# Patient Record
Sex: Male | Born: 1955 | Race: White | Hispanic: No | Marital: Married | Smoking: Former smoker
Health system: Southern US, Community
[De-identification: ages and names within clinical notes are randomized; demographics above are authoritative.]

## PROBLEM LIST (undated history)

## (undated) DIAGNOSIS — E119 Type 2 diabetes mellitus without complications: Secondary | ICD-10-CM

## (undated) DIAGNOSIS — I10 Essential (primary) hypertension: Secondary | ICD-10-CM

## (undated) HISTORY — PX: EYE SURGERY: SHX253

## (undated) HISTORY — PX: HERNIA REPAIR: SHX51

---

## 2002-12-06 ENCOUNTER — Encounter: Payer: Self-pay | Admitting: Emergency Medicine

## 2002-12-06 ENCOUNTER — Emergency Department (HOSPITAL_COMMUNITY): Admission: EM | Admit: 2002-12-06 | Discharge: 2002-12-06 | Payer: Self-pay | Admitting: Emergency Medicine

## 2014-10-03 ENCOUNTER — Encounter: Payer: Self-pay | Admitting: *Deleted

## 2014-10-03 ENCOUNTER — Emergency Department (INDEPENDENT_AMBULATORY_CARE_PROVIDER_SITE_OTHER): Payer: PRIVATE HEALTH INSURANCE

## 2014-10-03 ENCOUNTER — Emergency Department
Admission: EM | Admit: 2014-10-03 | Discharge: 2014-10-03 | Disposition: A | Discharge status: Home / Self Care | Payer: PRIVATE HEALTH INSURANCE | Source: Home / Self Care | Attending: Family Medicine | Admitting: Family Medicine

## 2014-10-03 DIAGNOSIS — S93401A Sprain of unspecified ligament of right ankle, initial encounter: Secondary | ICD-10-CM

## 2014-10-03 DIAGNOSIS — M7989 Other specified soft tissue disorders: Secondary | ICD-10-CM

## 2014-10-03 DIAGNOSIS — M7731 Calcaneal spur, right foot: Secondary | ICD-10-CM

## 2014-10-03 HISTORY — DX: Essential (primary) hypertension: I10

## 2014-10-03 HISTORY — DX: Type 2 diabetes mellitus without complications: E11.9

## 2014-10-03 NOTE — Discharge Instructions (Signed)
Apply ice pack for 30 minutes every 2 to 3 times daily until swelling decreases.  Elevate whenever possible.  Wear Ace wrap until swelling decreases.  Wear brace for about 3 to 4 weeks.  Begin range of motion and stretching exercises as per instruction sheet.  May take Ibuprofen 200mg , 4 tabs every 8 hours with food.    Ankle Sprain An ankle sprain is an injury to the strong, fibrous tissues (ligaments) that hold the bones of your ankle joint together.  CAUSES An ankle sprain is usually caused by a fall or by twisting your ankle. Ankle sprains most commonly occur when you step on the outer edge of your foot, and your ankle turns inward. People who participate in sports are more prone to these types of injuries.  SYMPTOMS   Pain in your ankle. The pain may be present at rest or only when you are trying to stand or walk.  Swelling.  Bruising. Bruising may develop immediately or within 1 to 2 days after your injury.  Difficulty standing or walking, particularly when turning corners or changing directions. DIAGNOSIS  Your caregiver will ask you details about your injury and perform a physical exam of your ankle to determine if you have an ankle sprain. During the physical exam, your caregiver will press on and apply pressure to specific areas of your foot and ankle. Your caregiver will try to move your ankle in certain ways. An X-ray exam may be done to be sure a bone was not broken or a ligament did not separate from one of the bones in your ankle (avulsion fracture).  TREATMENT  Certain types of braces can help stabilize your ankle. Your caregiver can make a recommendation for this. Your caregiver may recommend the use of medicine for pain. If your sprain is severe, your caregiver may refer you to a surgeon who helps to restore function to parts of your skeletal system (orthopedist) or a physical therapist. [place] ice to your injury for 1-2 days or as directed by your  caregiver. Applying ice helps to reduce inflammation and pain.  Put ice in a plastic bag.  Place a towel between your skin and the bag.  Leave the ice on for 15-20 minutes at a time, every 2 hours while you are awake.  Only take over-the-counter or prescription medicines for pain, discomfort, or fever as directed by your caregiver.  Elevate your injured ankle above the level of your heart as much as possible for 2-3 days.  If your caregiver recommends crutches, use them as instructed. Gradually put weight on the affected ankle. Continue to use crutches or a cane until you can walk without feeling pain in your ankle.  If you have a plaster splint, wear the splint as directed by your caregiver. Do not rest it on anything harder than a pillow for the first 24 hours. Do not put weight on it. Do not get it wet. You may take it off to take a shower or bath.  You may have been given an elastic bandage to wear around your ankle to provide support. If the elastic bandage is too tight (you have numbness or tingling in your foot or your foot becomes cold and blue), adjust the bandage to make it comfortable.  If you have an air splint, you may blow more air into it or let air out to make it more comfortable. You may take your splint off at night and before taking a  shower or bath. Wiggle your toes in the splint several times per day to decrease swelling. SEEK MEDICAL CARE IF:   You have rapidly increasing bruising or swelling.  Your toes feel extremely cold or you lose feeling in your foot.  Your pain is not relieved with medicine. SEEK IMMEDIATE MEDICAL CARE IF:  Your toes are numb or blue.  You have severe pain that is increasing. MAKE SURE YOU:   Understand these instructions.  Will watch your condition.  Will get help right away if you are not doing well or get worse. Document Released: 06/24/2005 Document Revised: 03/18/2012 Document Reviewed: 07/06/2011 [facility] Patient Information  2015 [place], [place]. This information is not intended to replace advice given to you by your health care provider. Make sure you discuss any questions you have with your health care provider.

## 2014-10-03 NOTE — ED Provider Notes (Signed)
CSN: [id number]     Arrival date & time 10/03/14  1553 History   First MD Initiated Contact with Patient 10/03/14 1645     Chief Complaint  Patient presents with  . Ankle Pain  . Foot Pain      HPI Comments: Patient inverted his right ankle one week ago while descending a step.  He had immediate pain/swelling that has not improved.  Patient is a 59 y.o. male presenting with ankle pain. The history is provided by the patient.  Ankle Pain Location:  Ankle Time since incident:  1 week Injury: yes   Mechanism of injury comment:  Inverted Ankle location:  R ankle Pain details:    Quality:  Aching   Radiates to:  Does not radiate   Severity:  Moderate   Onset quality:  Sudden   Duration:  1 week   Timing:  Constant   Progression:  Unchanged Chronicity:  New Dislocation: no   Prior injury to area:  No Relieved by:  Nothing Worsened by:  Bearing weight Ineffective treatments:  NSAIDs Associated symptoms: decreased ROM, stiffness and swelling   Associated symptoms: no back pain, no muscle weakness, no numbness and no tingling     Past Medical History  Diagnosis Date  . Hypertension   . Diabetes mellitus without complication    Past Surgical History  Procedure Laterality Date  . Eye surgery    . Hernia repair     History reviewed. No pertinent family history. History  Substance Use Topics  . Smoking status: Former Smoker -- 40 years    Quit date: 10/02/2012  . Smokeless tobacco: Not on file  . Alcohol Use: No    Review of Systems  Musculoskeletal: Positive for stiffness. Negative for back pain.  All other systems reviewed and are negative.   Allergies  Review of patient's allergies indicates no known allergies.  Home Medications   Prior to Admission medications   Medication Sig Start Date End Date Taking? Authorizing Provider  buPROPion (WELLBUTRIN) 75 MG tablet Take 75 mg by mouth 2 (two) times daily.   Yes Historical Provider, MD  hydrochlorothiazide  (HYDRODIURIL) 25 MG tablet Take 25 mg by mouth daily.   Yes Historical Provider, MD  insulin glargine (LANTUS) 100 UNIT/ML injection Inject into the skin at bedtime.   Yes Historical Provider, MD  Insulin Lispro, Human, (HUMALOG [place]) Inject into the skin.   Yes Historical Provider, MD  METFORMIN HCL PO Take by mouth.   Yes Historical Provider, MD  olmesartan (BENICAR) 20 MG tablet Take 20 mg by mouth daily.   Yes Historical Provider, MD  sitaGLIPtin (JANUVIA) 25 MG tablet Take 25 mg by mouth daily.   Yes Historical Provider, MD   BP 160/63 mmHg  Pulse 95  Resp 14  Ht 5\' 9"  (1.753 m)  Wt 189 lb (85.73 kg)  BMI 27.90 kg/m2  SpO2 98% Physical Exam  Constitutional: He is oriented to person, place, and time. He appears well-developed and well-nourished. No distress.  HENT:  Head: Normocephalic.  Eyes: Pupils are equal, round, and reactive to light.  Musculoskeletal:       Right ankle: He exhibits decreased range of motion, swelling and ecchymosis. He exhibits no deformity, no laceration and normal pulse. Tenderness. Lateral malleolus, medial malleolus, AITFL, posterior TFL and proximal fibula tenderness found. No CF ligament and no head of 5th metatarsal tenderness found. Achilles tendon normal.       Feet:   Right ankle:  Decreased range  of motion.  Tenderness and swelling over the lateral malleolus.  Joint stable.  No tenderness over the base of the fifth metatarsal.  Distal neurovascular function is intact.   Neurological: He is alert and oriented to person, place, and time.  Skin: Skin is warm and dry.  Nursing note and vitals reviewed.   ED Course  Procedures none     Imaging Review Dg Ankle Complete Right  10/03/2014   CLINICAL DATA:  Inversion injury of the right foot and ankle 1 week ago with persistent pain and swelling and bruising over the right ankle and foot  EXAM: RIGHT ANKLE - COMPLETE 3+ VIEW  COMPARISON:  None.  FINDINGS: The ankle joint mortise is preserved. The talar  dome is intact. There is no acute malleolar fracture. The talus and calcaneus exhibit no acute abnormalities. There are small plantar and Achilles region calcaneal spurs. There is soft tissue swelling anterior and laterally over the right ankle.  IMPRESSION: Considerable soft tissue swelling over the ankle. No acute fracture nor dislocation is demonstrated.   Electronically Signed   By: David  Martinique   On: 10/03/2014 17:18   Dg Foot Complete Right  10/03/2014   CLINICAL DATA:  Inversion injury of the right foot and ankle 1 week ago with persistent pain swelling and bruising laterally  EXAM: RIGHT FOOT COMPLETE - 3+ VIEW  COMPARISON:  None.  FINDINGS: The bones of the right foot are adequately mineralized. The phalanges and metatarsals are intact. There is mild narrowing of the first metatarsophalangeal joint. The interphalangeal joints exhibit mild narrowing at multiple levels. There are tiny bony densities adjacent to the base of the fifth metatarsal but a definite fracture fragment is not identified. The metatarsals and tarsals are unremarkable with exception of plantar and Achilles region calcaneal spurs.  IMPRESSION: 1. There is no acute fracture nor dislocation of the bones of the right foot. 2. There are mild degenerative changes as described. There are plantar and Achilles region spurs.   Electronically Signed   By: David  Martinique   On: 10/03/2014 17:20     MDM   1. Right ankle sprain, initial encounter    Ace wrap and AirCast stirrup splint applied. Apply ice pack for 30 minutes every 2 to 3 times daily until swelling decreases.  Elevate whenever possible.  Wear Ace wrap until swelling decreases.  Wear brace for about 3 to 4 weeks.  Begin range of motion and stretching exercises as per instruction sheet.  May take Ibuprofen 200mg , 4 tabs every 8 hours with food.  Followup with Dr. Aundria Mems ([facility]) if not improving about two weeks.     Kandra Nicolas, MD 10/03/14  1740

## 2014-10-03 NOTE — ED Notes (Signed)
Drew Gilbert reports rolling his right ankle/foot 1 week ago. Pain and swelling present.

## 2014-12-06 ENCOUNTER — Encounter: Payer: Self-pay | Admitting: *Deleted

## 2014-12-06 ENCOUNTER — Emergency Department
Admission: EM | Admit: 2014-12-06 | Discharge: 2014-12-06 | Disposition: A | Discharge status: Home / Self Care | Payer: Managed Care, Other (non HMO) | Source: Home / Self Care | Attending: Emergency Medicine | Admitting: Emergency Medicine

## 2014-12-06 DIAGNOSIS — R591 Generalized enlarged lymph nodes: Secondary | ICD-10-CM | POA: Diagnosis not present

## 2014-12-06 LAB — POCT CBC W AUTO DIFF (K'VILLE URGENT CARE)

## 2014-12-06 LAB — POCT FASTING CBG KUC MANUAL ENTRY: POCT GLUCOSE (MANUAL ENTRY) KUC: 227 mg/dL — AB (ref 70–99)

## 2014-12-06 MED ORDER — CEPHALEXIN 500 MG PO CAPS: 500.0000 mg | ORAL_CAPSULE | Freq: Four times a day (QID) | ORAL | Status: DC

## 2014-12-06 NOTE — ED Provider Notes (Signed)
CSN: [id number]     Arrival date & time 12/06/14  1245 History   First MD Initiated Contact with Patient 12/06/14 1319     Chief Complaint  Patient presents with  . Foot Pain  . axillary redness    (Consider location/radiation/quality/duration/timing/severity/associated sxs/prior Treatment) Patient is a 59 y.o. male presenting with lower extremity pain. The history is provided by the patient. No language interpreter was used.  Foot Pain This is a new problem. The current episode started 12 to 24 hours ago. The problem occurs constantly. The problem has been gradually worsening. Nothing aggravates the symptoms. Nothing relieves the symptoms. He has tried a cold compress for the symptoms.  Pt reports sore areas on both feet.  Pt worried aboutr infection.  Pt reports swollen lymph nodes both underarms with redness.  Pt is diabetic.   Past Medical History  Diagnosis Date  . Hypertension   . Diabetes mellitus without complication    Past Surgical History  Procedure Laterality Date  . Eye surgery    . Hernia repair     History reviewed. No pertinent family history. History  Substance Use Topics  . Smoking status: Former Smoker -- 88 years    Quit date: 10/02/2012  . Smokeless tobacco: Not on file  . Alcohol Use: No    Review of Systems  Musculoskeletal: Positive for joint swelling.  Skin: Positive for rash.  All other systems reviewed and are negative.   Allergies  Review of patient's allergies indicates no known allergies.  Home Medications   Prior to Admission medications   Medication Sig Start Date End Date Taking? Authorizing Provider  buPROPion (WELLBUTRIN) 75 MG tablet Take 75 mg by mouth 2 (two) times daily.    Historical Provider, MD  cephALEXin (KEFLEX) 500 MG capsule Take 1 capsule (500 mg total) by mouth 4 (four) times daily. 12/06/14   Fransico Meadow, PA-C  hydrochlorothiazide (HYDRODIURIL) 25 MG tablet Take 25 mg by mouth daily.    Historical Provider, MD   insulin glargine (LANTUS) 100 UNIT/ML injection Inject into the skin at bedtime.    Historical Provider, MD  Insulin Lispro, Human, (HUMALOG ) Inject into the skin.    Historical Provider, MD  METFORMIN HCL PO Take by mouth.    Historical Provider, MD  olmesartan (BENICAR) 20 MG tablet Take 20 mg by mouth daily.    Historical Provider, MD  sitaGLIPtin (JANUVIA) 25 MG tablet Take 25 mg by mouth daily.    Historical Provider, MD   BP 136/82 mmHg  Pulse 115  Temp(Src) 98.5 F (36.9 C) (Oral)  Resp 14  Wt 179 lb (81.194 kg)  SpO2 97% Physical Exam  Constitutional: He is oriented to person, place, and time. He appears well-developed and well-nourished.  HENT:  Head: Normocephalic.  Eyes: EOM are normal.  Neck: Normal range of motion.  Cardiovascular: Normal rate and normal heart sounds.   Pulmonary/Chest: Effort normal.  Abdominal: He exhibits no distension.  Musculoskeletal:  Redness bottom of right foot,  No streaking,  Left foot 1cm firm area   bilat axilla erythema  Appears to infected hair follicles,  Swollen lymph nodes   Neurological: He is alert and oriented to person, place, and time.  Psychiatric: He has a normal mood and affect.  Nursing note and vitals reviewed.   ED Course  Procedures (including critical care time) Labs Review Labs Reviewed  POCT FASTING CBG [facility] MANUAL ENTRY - Abnormal; Notable for the following:    POCT Glucose (  [facility]) 227 (*)    All other components within normal limits  POCT CBC W AUTO DIFF ([facility])   Wbc's 10 Imaging Review No results found.   MDM   1. Lymphadenopathy    Keflex avs See Dr. Maudie Mercury for recheck in 2-3 days.    Fransico Meadow, PA-C 12/06/14 [place], PA-C 12/06/14 [id number]

## 2014-12-06 NOTE — ED Notes (Signed)
Pt c/o 24 hours of plantart redness, swelling and pain as well as axillary redness and tenderness.

## 2014-12-06 NOTE — Discharge Instructions (Signed)
Swollen Lymph Nodes  The lymphatic system filters fluid from around cells. It is like a system of blood vessels. These channels carry lymph instead of blood. The lymphatic system is an important part of the immune (disease fighting) system. When people talk about "swollen glands in the neck," they are usually talking about swollen lymph nodes. The lymph nodes are like the little traps for infection. You and your caregiver may be able to feel lymph nodes, especially swollen nodes, in these common areas: the groin (inguinal area), armpits (axilla), and above the clavicle (supraclavicular). You may also feel them in the neck (cervical) and the back of the head just above the hairline (occipital).  Swollen glands occur when there is any condition in which the body responds with an allergic type of reaction. For instance, the glands in the neck can become swollen from insect bites or any type of minor infection on the head. These are very noticeable in children with only minor problems. Lymph nodes may also become swollen when there is a tumor or problem with the lymphatic system, such as Hodgkin's disease.  TREATMENT    Most swollen glands do not require treatment. They can be observed (watched) for a short period of time, if your caregiver feels it is necessary. Most of the time, observation is not necessary.   Antibiotics (medicines that kill germs) may be prescribed by your caregiver. Your caregiver may prescribe these if he or she feels the swollen glands are due to a bacterial (germ) infection. Antibiotics are not used if the swollen glands are caused by a virus.  HOME CARE INSTRUCTIONS    Take medications as directed by your caregiver. Only take over-the-counter or prescription medicines for pain, discomfort, or fever as directed by your caregiver.  SEEK MEDICAL CARE IF:    If you begin to run a temperature greater than 102 F (38.9 C), or as your caregiver suggests.  MAKE SURE YOU:    Understand these  instructions.   Will watch your condition.   Will get help right away if you are not doing well or get worse.  Document Released: 06/14/2002 Document Revised: 09/16/2011 Document Reviewed: 06/24/2005  [employer] Patient Information 2015 [employer]. This information is not intended to replace advice given to you by your health care provider. Make sure you discuss any questions you have with your health care provider.

## 2016-04-01 ENCOUNTER — Encounter: Payer: Self-pay | Admitting: *Deleted

## 2016-04-01 ENCOUNTER — Emergency Department (INDEPENDENT_AMBULATORY_CARE_PROVIDER_SITE_OTHER)
Admission: EM | Admit: 2016-04-01 | Discharge: 2016-04-01 | Disposition: A | Discharge status: Home / Self Care | Payer: Medicare Other | Source: Home / Self Care | Attending: Family Medicine | Admitting: Family Medicine

## 2016-04-01 ENCOUNTER — Emergency Department (INDEPENDENT_AMBULATORY_CARE_PROVIDER_SITE_OTHER): Payer: Medicare Other

## 2016-04-01 DIAGNOSIS — R109 Unspecified abdominal pain: Secondary | ICD-10-CM

## 2016-04-01 DIAGNOSIS — N200 Calculus of kidney: Secondary | ICD-10-CM

## 2016-04-01 DIAGNOSIS — K760 Fatty (change of) liver, not elsewhere classified: Secondary | ICD-10-CM | POA: Diagnosis not present

## 2016-04-01 LAB — POCT CBC W AUTO DIFF (K'VILLE URGENT CARE)

## 2016-04-01 LAB — POCT URINALYSIS DIP (MANUAL ENTRY)
BILIRUBIN UA: NEGATIVE
Bilirubin, UA: NEGATIVE
Blood, UA: NEGATIVE
Glucose, UA: 100 — AB
LEUKOCYTES UA: NEGATIVE
Nitrite, UA: NEGATIVE
PH UA: 5 (ref 5–8)
Spec Grav, UA: >=1.03 (ref 1.005–1.03)
Urobilinogen, UA: 0.2 (ref 0–1)

## 2016-04-01 MED ORDER — METRONIDAZOLE 500 MG PO TABS: 500.0000 mg | ORAL_TABLET | Freq: Four times a day (QID) | ORAL | 0 refills | Status: AC

## 2016-04-01 MED ORDER — HYDROCODONE-ACETAMINOPHEN 5-325 MG PO TABS: 1.0000 | ORAL_TABLET | ORAL | 0 refills | Status: AC | PRN

## 2016-04-01 MED ORDER — CIPROFLOXACIN HCL 500 MG PO TABS: 500.0000 mg | ORAL_TABLET | Freq: Two times a day (BID) | ORAL | 0 refills | Status: AC

## 2016-04-01 NOTE — ED Provider Notes (Signed)
[facility]    CSN: [id number] Arrival date & time: 04/01/16  1322  First Provider Contact:  None       History   Chief Complaint Chief Complaint  Patient presents with  . Flank Pain    HPI Drew Gilbert is a 60 y.o. male.   Patient complains of 6 day history of left flank pain radiating to his left lower abdomen.  The pain is a dull ache, worse at night.  He states that he has had some frequency and urgency, but no dysuria or hematuria.  His bowel movements have generally been normal except for bloating and mild constipation during the past several days (last BM yesterday).  He reports that his appetite has been decreased, with nausea but no vomiting.  He states that he has eaten only vegan meals during the past two months.  No fevers, chills, and sweats although he has felt hot. He has a past history of frequent kidney stones, having passed at least 25 (one requiring lithotripsy).  He last passed a stone about 7 years ago.  He states that he started going to a gym recently, but recalls no injury. He notes that he also had an episode of diverticulitis at age 56.  He had a normal colonoscopy 9 years ago.  He also has a surgical history of penile implant January 2017.    Flank Pain  This is a new problem. Episode onset: 6 days ago. The problem occurs constantly. The problem has not changed since onset.Associated symptoms include abdominal pain. Pertinent negatives include no chest pain and no headaches. Nothing aggravates the symptoms. Nothing relieves the symptoms. He has tried acetaminophen (muscle relaxant) for the symptoms. The treatment provided no relief.    Past Medical History:  Diagnosis Date  . Diabetes mellitus without complication ([place])   . Hypertension     There are no active problems to display for this patient.   Past Surgical History:  Procedure Laterality Date  . EYE SURGERY    . HERNIA REPAIR         Home Medications    Prior to  Admission medications   Medication Sig Start Date End Date Taking? Authorizing Provider  buPROPion (WELLBUTRIN) 75 MG tablet Take 75 mg by mouth 2 (two) times daily.    Historical Provider, MD  ciprofloxacin (CIPRO) 500 MG tablet Take 1 tablet (500 mg total) by mouth 2 (two) times daily. 04/01/16   Kandra Nicolas, MD  hydrochlorothiazide (HYDRODIURIL) 25 MG tablet Take 25 mg by mouth daily.    Historical Provider, MD  HYDROcodone-acetaminophen (NORCO/VICODIN) 5-325 MG tablet Take 1 tablet by mouth every 4 (four) hours as needed for moderate pain. 04/01/16   Kandra Nicolas, MD  insulin glargine (LANTUS) 100 UNIT/ML injection Inject into the skin at bedtime.    Historical Provider, MD  Insulin Lispro, Human, (HUMALOG [place]) Inject into the skin.    Historical Provider, MD  METFORMIN HCL PO Take by mouth.    Historical Provider, MD  metroNIDAZOLE (FLAGYL) 500 MG tablet Take 1 tablet (500 mg total) by mouth 4 (four) times daily. 04/01/16   Kandra Nicolas, MD  olmesartan (BENICAR) 20 MG tablet Take 20 mg by mouth daily.    Historical Provider, MD  sitaGLIPtin (JANUVIA) 25 MG tablet Take 25 mg by mouth daily.    Historical Provider, MD    Family History History reviewed. No pertinent family history.  Social History Social History  Substance Use Topics  .  Smoking status: Former Smoker    Years: 35.00    Quit date: 10/02/2012  . Smokeless tobacco: Never Used  . Alcohol use No     Allergies   Review of patient's allergies indicates no known allergies.   Review of Systems Review of Systems  Constitutional: Negative.   HENT: Negative.   Eyes: Negative.   Respiratory: Negative.   Cardiovascular: Negative for chest pain.  Gastrointestinal: Positive for abdominal pain, constipation and nausea. Negative for abdominal distention, blood in stool, diarrhea, rectal pain and vomiting.  Genitourinary: Positive for flank pain and frequency. Negative for decreased urine volume, dysuria, hematuria,  penile pain, scrotal swelling, testicular pain and urgency.  Skin: Negative.   Neurological: Negative for headaches.     Physical Exam Triage Vital Signs ED Triage Vitals  Enc Vitals Group     BP 04/01/16 1358 155/90     Pulse Rate 04/01/16 1358 82     Resp 04/01/16 1358 16     Temp 04/01/16 1358 98.2 F (36.8 C)     Temp Source 04/01/16 1358 Oral     SpO2 04/01/16 1358 98 %     Weight 04/01/16 1359 183 lb (83 kg)     Height 04/01/16 1359 5\' 8"  (1.727 m)     Head Circumference --      Peak Flow --      Pain Score 04/01/16 1402 9     Pain Loc --      Pain Edu? --      Excl. in [place]? --    No data found.   Updated Vital Signs BP 155/90 (BP Location: Left Arm)   Pulse 82   Temp 98.2 F (36.8 C) (Oral)   Resp 16   Ht 5\' 8"  (1.727 m)   Wt 183 lb (83 kg)   SpO2 98%   BMI 27.83 kg/m   Visual Acuity Right Eye Distance:   Left Eye Distance:   Bilateral Distance:    Right Eye Near:   Left Eye Near:    Bilateral Near:     Physical Exam  Constitutional: He appears well-developed and well-nourished. No distress.  HENT:  Head: Normocephalic.  Right Ear: External ear normal.  Left Ear: External ear normal.  Nose: Nose normal.  Mouth/Throat: Oropharynx is clear and moist.  Eyes: Conjunctivae are normal. Pupils are equal, round, and reactive to light.  Neck: Neck supple.  Cardiovascular: Normal heart sounds.   Pulmonary/Chest: Breath sounds normal.  Abdominal: Soft. Bowel sounds are normal. He exhibits no distension and no mass. There is no hepatosplenomegaly. There is tenderness. There is no rebound, no guarding, no tenderness at McBurney's point and negative Murphy's sign. No hernia.    Abdomen has vague mild tenderness to palpation left lower quadrant.  Musculoskeletal: He exhibits no edema.  Lymphadenopathy:    He has no cervical adenopathy.  Neurological: He is alert.  Skin: Skin is warm and dry. No rash noted.  Nursing note and vitals reviewed.    UC  Treatments / Results  Labs (all labs ordered are listed, but only abnormal results are displayed) Labs Reviewed  POCT URINALYSIS DIP (MANUAL ENTRY) - Abnormal; Notable for the following:       Result Value   Glucose, UA =100 (*)    Protein Ur, POC =30 (*) SG >= 1.030  PRO 30mg /dL    All other components within normal limits  POCT CBC W AUTO DIFF ([facility]):  WBC 6.9; LY 37.3; MO  2.4; GR 60.3; Hgb 15.4; Platelets 167     EKG  EKG Interpretation None       Radiology Ct Renal Stone Study  Result Date: 04/01/2016 CLINICAL DATA:  Patient with nausea and left flank pain for 5 days. History of renal stones. EXAM: CT ABDOMEN AND PELVIS WITHOUT CONTRAST TECHNIQUE: Multidetector CT imaging of the abdomen and pelvis was performed following the standard protocol without IV contrast. COMPARISON:  None. FINDINGS: Lower chest: Normal heart size. Coronary arterial vascular calcifications. Minimal atelectasis and or scarring within the right lower lobe. No pleural effusion. Hepatobiliary: Liver is normal in size and contour. Liver is diffusely low in attenuation. Gallbladder is unremarkable. Pancreas: Unremarkable Spleen: Unremarkable Adrenals/Urinary Tract: The adrenal glands are normal. Nonspecific bilateral perinephric fat stranding. 2 mm stone within the inferior pole of the left kidney. 2 mm stone within the interpolar region of the left kidney. No hydronephrosis. No ureterolithiasis. Urinary bladder is unremarkable. Stomach/Bowel: Sigmoid colonic diverticulosis. No CT evidence for acute diverticulitis. The appendix is normal. No abnormal bowel wall thickening or evidence for bowel obstruction. No free fluid or free intraperitoneal air. Normal morphology of the stomach. Vascular/Lymphatic: Normal caliber abdominal aorta. Peripheral calcified atherosclerotic plaque. Circumaortic left renal vein. No retroperitoneal lymphadenopathy. Reproductive: Prostate is enlarged. Central dystrophic  calcifications. Other: Penile prosthesis. Reservoir within the left lower quadrant. Small bilateral fat containing inguinal hernias, left-greater-than-right. There is a small (2 cm) fatty mass within the left lower quadrant with peripheral dystrophic calcification (image 66; series 2). 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 Musculoskeletal: Lumbar spine degenerative changes. No aggressive or acute appearing osseous lesions. Probable bone island within the left sequela (image 73; series 2) IMPRESSION: No acute process within the abdomen or pelvis. No ureterolithiasis.  No hydronephrosis. Hepatic steatosis. Nonobstructing stones within the left kidney. Electronically Signed   By: Lovey Newcomer M.D.   On: 04/01/2016 15:46    Procedures Procedures (including critical care time)  Medications Ordered in UC Medications - No data to display   Initial Impression / Assessment and Plan / UC Course  I have reviewed the triage vital signs and the nursing notes.  Pertinent labs & imaging results that were available during my care of the patient were reviewed by me and considered in my medical decision making (see chart for details).  Clinical Course  No evidence for ureterolithiasis. Although abdominal scan showed no CT evidence for acute diverticulitis, will empirically begin Cipro and Flagyl for five days.  Rx for Lortab for pain. Begin clear liquids for about 24 hours, then may begin a [employer] (Bananas, Rice, Applesauce, Toast). Then gradually advance to a regular diet as tolerated.    Call if rash develops (would begin Valtex). If symptoms become significantly worse during the night or over the weekend, proceed to the local emergency room.  Followup with Family Doctor if not improved in one week.     Final Clinical Impressions(s) / UC Diagnoses   Final diagnoses:  Left flank pain ?etiology    New Prescriptions New Prescriptions   CIPROFLOXACIN (CIPRO) 500 MG TABLET    Take 1 tablet  (500 mg total) by mouth 2 (two) times daily.   HYDROCODONE-ACETAMINOPHEN (NORCO/VICODIN) 5-325 MG TABLET    Take 1 tablet by mouth every 4 (four) hours as needed for moderate pain.   METRONIDAZOLE (FLAGYL) 500 MG TABLET    Take 1 tablet (500 mg total) by mouth 4 (four) times daily.     Kandra Nicolas, MD  04/09/16 1904  

## 2016-04-01 NOTE — ED Triage Notes (Signed)
Pt c/o LT flank pain, bloating and constipation x 6 days. He reports a hx of kidney stones. Denies hematuria. Denies injury, but did recently start going to the gym. He has taken a muscle relaxer at 0300 and tylenol last night without relief.

## 2016-04-01 NOTE — Discharge Instructions (Signed)
Begin clear liquids for about 24 hours, then may begin a [employer] (Bananas, Rice, Applesauce, Toast). Then gradually advance to a regular diet as tolerated.    Call if rash develops. If symptoms become significantly worse during the night or over the weekend, proceed to the local emergency room.

## 2019-12-03 ENCOUNTER — Emergency Department (HOSPITAL_COMMUNITY)
Admission: EM | Admit: 2019-12-03 | Discharge: 2019-12-03 | Disposition: A | Discharge status: Home / Self Care | Payer: Medicare Other | Attending: Emergency Medicine | Admitting: Emergency Medicine

## 2019-12-03 ENCOUNTER — Encounter (HOSPITAL_COMMUNITY): Payer: Self-pay | Admitting: Emergency Medicine

## 2019-12-03 ENCOUNTER — Emergency Department (HOSPITAL_COMMUNITY): Payer: Medicare Other

## 2019-12-03 ENCOUNTER — Other Ambulatory Visit: Payer: Self-pay

## 2019-12-03 DIAGNOSIS — I10 Essential (primary) hypertension: Secondary | ICD-10-CM | POA: Diagnosis not present

## 2019-12-03 DIAGNOSIS — R1084 Generalized abdominal pain: Secondary | ICD-10-CM | POA: Diagnosis not present

## 2019-12-03 DIAGNOSIS — Z7984 Long term (current) use of oral hypoglycemic drugs: Secondary | ICD-10-CM | POA: Insufficient documentation

## 2019-12-03 DIAGNOSIS — Z79899 Other long term (current) drug therapy: Secondary | ICD-10-CM | POA: Diagnosis not present

## 2019-12-03 DIAGNOSIS — R531 Weakness: Secondary | ICD-10-CM | POA: Insufficient documentation

## 2019-12-03 DIAGNOSIS — Z87891 Personal history of nicotine dependence: Secondary | ICD-10-CM | POA: Insufficient documentation

## 2019-12-03 DIAGNOSIS — K8020 Calculus of gallbladder without cholecystitis without obstruction: Secondary | ICD-10-CM

## 2019-12-03 DIAGNOSIS — E860 Dehydration: Secondary | ICD-10-CM | POA: Diagnosis not present

## 2019-12-03 DIAGNOSIS — E119 Type 2 diabetes mellitus without complications: Secondary | ICD-10-CM | POA: Insufficient documentation

## 2019-12-03 DIAGNOSIS — R634 Abnormal weight loss: Secondary | ICD-10-CM | POA: Insufficient documentation

## 2019-12-03 LAB — COMPREHENSIVE METABOLIC PANEL
ALT: 20 U/L (ref 0–44)
AST: 17 U/L (ref 15–41)
Albumin: 4.5 g/dL (ref 3.5–5.0)
Alkaline Phosphatase: 65 U/L (ref 38–126)
Anion gap: 12 (ref 5–15)
BUN: 23 mg/dL (ref 8–23)
CO2: 30 mmol/L (ref 22–32)
Calcium: 9.9 mg/dL (ref 8.9–10.3)
Chloride: 97 mmol/L — ABNORMAL LOW (ref 98–111)
Creatinine, Ser: 1.22 mg/dL (ref 0.61–1.24)
GFR calc Af Amer: >60 mL/min (ref >60)
GFR calc non Af Amer: >60 mL/min (ref >60)
Glucose, Bld: 259 mg/dL — ABNORMAL HIGH (ref 70–99)
Potassium: 4.4 mmol/L (ref 3.5–5.1)
Sodium: 139 mmol/L (ref 135–145)
Total Bilirubin: 1 mg/dL (ref 0.3–1.2)
Total Protein: 8.6 g/dL — ABNORMAL HIGH (ref 6.5–8.1)

## 2019-12-03 LAB — CBC
HCT: 49.7 % (ref 39.0–52.0)
Hemoglobin: 16.8 g/dL (ref 13.0–17.0)
MCH: 32.6 pg (ref 26.0–34.0)
MCHC: 33.8 g/dL (ref 30.0–36.0)
MCV: 96.5 fL (ref 80.0–100.0)
Platelets: 263 10*3/uL (ref 150–400)
RBC: 5.15 MIL/uL (ref 4.22–5.81)
RDW: 12.3 % (ref 11.5–15.5)
WBC: 7.7 10*3/uL (ref 4.0–10.5)
nRBC: 0 % (ref 0.0–0.2)

## 2019-12-03 LAB — URINALYSIS, ROUTINE W REFLEX MICROSCOPIC
Bacteria, UA: NONE SEEN
Bilirubin Urine: NEGATIVE
Glucose, UA: 50 mg/dL — AB
Hgb urine dipstick: NEGATIVE
Ketones, ur: 5 mg/dL — AB
Leukocytes,Ua: NEGATIVE
Nitrite: NEGATIVE
Protein, ur: 30 mg/dL — AB
Specific Gravity, Urine: >1.046 — ABNORMAL HIGH (ref 1.005–1.030)
pH: 5 (ref 5.0–8.0)

## 2019-12-03 LAB — LIPASE, BLOOD: Lipase: 32 U/L (ref 11–51)

## 2019-12-03 MED ORDER — SODIUM CHLORIDE 0.9 % IV BOLUS
1000.0000 mL | Freq: Once | INTRAVENOUS | Status: AC
  Administered 2019-12-03: 1000 mL via INTRAVENOUS

## 2019-12-03 MED ORDER — TAMSULOSIN HCL 0.4 MG PO CAPS: 0.4000 mg | ORAL_CAPSULE | Freq: Every day | ORAL | 0 refills | Status: AC

## 2019-12-03 MED ORDER — IOHEXOL 300 MG/ML  SOLN
100.0000 mL | Freq: Once | INTRAMUSCULAR | Status: AC | PRN
  Administered 2019-12-03: 100 mL via INTRAVENOUS

## 2019-12-03 MED ORDER — SODIUM CHLORIDE 0.9% FLUSH: 3.0000 mL | Freq: Once | INTRAVENOUS | Status: DC

## 2019-12-03 MED ORDER — SODIUM CHLORIDE (PF) 0.9 % IJ SOLN
INTRAMUSCULAR | Status: AC
  Filled 2019-12-03: qty 50

## 2019-12-03 NOTE — ED Triage Notes (Signed)
Pt sent by PCP for abd issues, fatigue, dehydration. Had nausea, abd pain and back pains x 8 days. Not been eating due to abd pains getting worse

## 2019-12-03 NOTE — ED Notes (Signed)
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

## 2019-12-03 NOTE — Discharge Instructions (Signed)
As discussed, your evaluation today has been largely reassuring.  But, it is important that you monitor your condition carefully, and do not hesitate to return to the ED if you develop new, or concerning changes in your condition. ? ?Otherwise, please follow-up with your physician for appropriate ongoing care. ? ?

## 2019-12-03 NOTE — ED Provider Notes (Signed)
[facility] DEPT Provider Note   CSN: [id number] Arrival date & time: 12/03/19  1130     History Chief Complaint  Patient presents with  . Nausea  . Back Pain  . sent by PCP    for fluids     Drew Gilbert is a 64 y.o. male.  HPI    Patient presents with concern of weakness, anorexia, weight loss, abdominal pain. He is here with his wife, and history is obtained by him, and his chart from his primary care physician's office.  He was seen earlier in the day due to these concerns.  Onset seems to been a few weeks, possibly longer in the past.  However, he is increasingly symptomatic over the past few days, to the point where he now requires multiple rest periods before performing ADL.  There is both fatigue and weakness, diffusely without focality.  No fever.  He does have some inconsistent abdominal pain, though none currently. No confusion, disorientation. No recent medication change, diet change, activity change beyond those associated with his anorexia. Today after seeing his physician, having abnormal test results he was sent here for evaluation. Past Medical History:  Diagnosis Date  . Diabetes mellitus without complication ([place])   . Hypertension     There are no problems to display for this patient.   Past Surgical History:  Procedure Laterality Date  . EYE SURGERY    . HERNIA REPAIR         No family history on file.  Social History   Tobacco Use  . Smoking status: Former Smoker    Years: 35.00    Quit date: 10/02/2012    Years since quitting: 7.1  . Smokeless tobacco: Never Used  Substance Use Topics  . Alcohol use: No  . Drug use: No    Home Medications Prior to Admission medications   Medication Sig Start Date End Date Taking? Authorizing Provider  APPLE CIDER VINEGAR PO Take 1 capsule by mouth daily.   Yes [provider]  Ascorbic Acid (VITAMIN C) 100 MG tablet Take 100 mg by mouth daily.   Yes [provider]  busPIRone (BUSPAR) 15 MG tablet Take 15 mg by mouth 2 (two) times daily. 11/09/19  Yes [provider]  gabapentin (NEURONTIN) 300 MG capsule Take 300 mg by mouth at bedtime.   Yes [provider]  glimepiride (AMARYL) 2 MG tablet Take 2 mg by mouth daily with breakfast.   Yes [provider]  losartan (COZAAR) 100 MG tablet Take 100 mg by mouth daily. 11/08/19  Yes [provider]  metFORMIN (GLUCOPHAGE-XR) 500 MG 24 hr tablet Take 1,000 mg by mouth in the morning and at bedtime.   Yes [provider]  metoprolol tartrate (LOPRESSOR) 50 MG tablet Take 50 mg by mouth 2 (two) times daily. 11/09/19  Yes [provider]  milk thistle 175 MG tablet Take 175 mg by mouth daily.   Yes [provider]  olmesartan (BENICAR) 20 MG tablet Take 20 mg by mouth daily.   Yes [provider]  Omega-3 Fatty Acids (FISH OIL) 1000 MG CAPS Take 1 capsule by mouth daily.   Yes [provider]  ondansetron (ZOFRAN) 8 MG tablet Take 8 mg by mouth 3 (three) times daily as needed for nausea or vomiting.  11/30/19  Yes [provider]  sildenafil (REVATIO) 20 MG tablet Take 20 mg by mouth daily as needed (ed).  11/21/19  Yes [provider]  simvastatin (ZOCOR) 20 MG tablet Take 20 mg by mouth every evening.  11/09/19  Yes [provider]  trazodone (DESYREL) 300 MG tablet Take 300 mg by mouth at bedtime as needed. 11/13/19  Yes [provider]  vitamin E 1000 UNIT capsule Take 1,000 Units by mouth daily.   Yes [provider]  ciprofloxacin (CIPRO) 500 MG tablet Take 1 tablet (500 mg total) by mouth 2 (two) times daily. Patient not taking: Reported on 12/03/2019 04/01/16   Kandra Nicolas, MD  HYDROcodone-acetaminophen (NORCO/VICODIN) 5-325 MG tablet Take 1 tablet by mouth every 4 (four) hours as needed for moderate pain. Patient not taking: Reported on 12/03/2019 04/01/16   Kandra Nicolas, MD    metroNIDAZOLE (FLAGYL) 500 MG tablet Take 1 tablet (500 mg total) by mouth 4 (four) times daily. Patient not taking: Reported on 12/03/2019 04/01/16   Kandra Nicolas, MD    Allergies    Patient has no known allergies.  Review of Systems   Review of Systems  Constitutional:       Per HPI, otherwise negative  HENT:       Per HPI, otherwise negative  Respiratory:       Per HPI, otherwise negative  Cardiovascular:       Per HPI, otherwise negative  Gastrointestinal: Positive for abdominal pain and nausea. Negative for vomiting.  Endocrine:       Negative aside from HPI  Genitourinary:       Neg aside from HPI   Musculoskeletal:       Per HPI, otherwise negative  Skin: Negative.   Neurological: Positive for weakness. Negative for syncope.    Physical Exam Updated Vital Signs BP (!) 167/83 (BP Location: Right Arm)   Pulse 79   Temp 98.1 F (36.7 C)   Resp 17   SpO2 97%   Physical Exam Vitals and nursing note reviewed.  Constitutional:      General: He is not in acute distress.    Appearance: He is well-developed.  HENT:     Head: Normocephalic and atraumatic.  Eyes:     Conjunctiva/sclera: Conjunctivae normal.  Cardiovascular:     Rate and Rhythm: Normal rate and regular rhythm.  Pulmonary:     Effort: Pulmonary effort is normal. No respiratory distress.     Breath sounds: No stridor.  Abdominal:     General: There is no distension.     Tenderness: There is generalized abdominal tenderness.  Skin:    General: Skin is warm and dry.  Neurological:     Mental Status: He is alert and oriented to person, place, and time.     ED Results / Procedures / Treatments   Labs (all labs ordered are listed, but only abnormal results are displayed) Labs Reviewed  COMPREHENSIVE METABOLIC PANEL - Abnormal; Notable for the following components:      Result Value   Chloride 97 (*)    Glucose, Bld 259 (*)    Total Protein 8.6 (*)    All other components within normal  limits  URINALYSIS, ROUTINE W REFLEX MICROSCOPIC - Abnormal; Notable for the following components:   Specific Gravity, Urine >1.046 (*)    Glucose, UA 50 (*)    Ketones, ur 5 (*)    Protein, ur 30 (*)    All other components within normal limits  LIPASE, BLOOD  CBC    EKG None  Radiology CT Abdomen Pelvis W Contrast  Result Date: 12/03/2019  CLINICAL DATA:  Fatigue. Nausea and abdominal pain. Back pain. EXAM: CT ABDOMEN AND PELVIS WITH CONTRAST TECHNIQUE: Multidetector CT imaging of the abdomen and pelvis was performed using the standard protocol following bolus administration of intravenous contrast. CONTRAST:  134mL OMNIPAQUE IOHEXOL 300 MG/ML  SOLN COMPARISON:  04/01/2016 FINDINGS: Lower chest: The lung bases are clear. The heart size is normal. Hepatobiliary: There is decreased hepatic attenuation suggestive of hepatic steatosis. There is a questionable gallstone near the gallbladder neck. Alternatively, this may represent a prominent gallbladder fold.There is no biliary ductal dilation. Pancreas: Normal contours without ductal dilatation. No peripancreatic fluid collection. Spleen: Unremarkable. Adrenals/Urinary Tract: --Adrenal glands: Unremarkable. --Right kidney/ureter: No hydronephrosis or radiopaque kidney stones. --Left kidney/ureter: There are multiple nonobstructing stones involving the left kidney. --Urinary bladder: Unremarkable. Stomach/Bowel: --Stomach/Duodenum: No hiatal hernia or other gastric abnormality. Normal duodenal course and caliber. --Small bowel: Unremarkable. --Colon: Rectosigmoid diverticulosis without acute inflammation. --Appendix: Normal. Vascular/Lymphatic: Atherosclerotic calcification is present within the non-aneurysmal abdominal aorta, without hemodynamically significant stenosis. There is a circumaortic left renal vein, a normal variant. --No retroperitoneal lymphadenopathy. --No mesenteric lymphadenopathy. --No pelvic or inguinal lymphadenopathy. Reproductive:  The prostate gland is significantly enlarged. A penile prosthesis is noted. Other: No ascites or free air. There are bilateral fat containing inguinal hernias. Musculoskeletal. 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 There is a stable sclerotic lesion in the left hemi sacrum. IMPRESSION: 1. No acute abdominopelvic abnormality. 2. Hepatic steatosis. 3. Possible gallstone near the gallbladder neck. Alternatively, this may represent a prominent gallbladder fold. Consider further evaluation with right upper quadrant ultrasound. 4. Nonobstructive left nephrolithiasis. 5. Rectosigmoid diverticulosis without acute inflammation. 6. Bilateral fat containing inguinal hernias. 7. Markedly enlarged prostate gland. Aortic Atherosclerosis (ICD10-I70.0). Electronically Signed   By: Constance Holster M.D.   On: 12/03/2019 20:17   US Abdomen Limited RUQ  Result Date: 12/03/2019 CLINICAL DATA:  Gallstones EXAM: ULTRASOUND ABDOMEN LIMITED RIGHT UPPER QUADRANT COMPARISON:  CT 12/03/2019 FINDINGS: Gallbladder: Sludge in the gallbladder. No shadowing stone. Normal gallbladder wall thickness. Negative sonographic Murphy Common bile duct: Diameter: 6 mm Liver: Liver is echogenic. No focal hepatic abnormality. Portal vein is patent on color Doppler imaging with normal direction of blood flow towards the liver. Other: None. IMPRESSION: 1. Gallbladder sludge without shadowing stone. No sonographic features to suggest acute cholecystitis 2. Echogenic liver consistent with steatosis. Electronically Signed   By: Donavan Foil M.D.   On: 12/03/2019 21:26    Procedures Procedures (including critical care time)  Medications Ordered in ED Medications  sodium chloride flush (NS) 0.9 % injection 3 mL (3 mLs Intravenous Not Given 12/03/19 2042)  sodium chloride (PF) 0.9 % injection (has no administration in time range)  sodium chloride 0.9 % bolus 1,000 mL (0 mLs Intravenous Stopped 12/03/19 2129)    iohexol (OMNIPAQUE) 300 MG/ML solution 100 mL (100 mLs Intravenous Contrast Given 12/03/19 1959)    ED Course  I have reviewed the triage vital signs and the nursing notes.  Pertinent labs & imaging results that were available during my care of the patient were reviewed by me and considered in my medical decision making (see chart for details). Update:, Initial CT scan suggests possible biliary lesion, labs otherwise generally reassuring aside from some evidence for dehydration.  Ultrasound ordered 10:50 PM Ultrasound results reviewed, remaining labs including urinalysis reviewed, discussed with the patient and his wife. Findings consistent with mild dehydration, otherwise generally reassuring.  There is some evidence for prostate enlargement as well.  Patient remains awake, alert, is now sitting upright, fully dressed, speaking clearly. He notes that he feels somewhat better after fluid resuscitation.  This 64 year old male presents with concern for weakness, anorexia, weight loss, abdominal pain. Evaluation here generally reassuring, though there is evidence for dehydration.  No evidence for obvious mass, hepatobiliary lesions, dysfunction, urinary tract infection. Some suspicion for dehydration versus prostatic mass-effect/urinary frequency contributing to his discomfort.  With improvement here following fluid resuscitation, absence of other acute findings, patient amenable to, appropriate for follow-up with primary care, GI and urology (patient requests Flomax). Final Clinical Impression(s) / ED Diagnoses Final diagnoses:  Weakness  Dehydration  Generalized abdominal pain    Rx / DC Orders ED Discharge Orders         Ordered    tamsulosin (FLOMAX) 0.4 MG CAPS capsule  Daily     12/03/19 2253           Carmin Muskrat, MD 12/03/19 2253

## 2020-02-24 ENCOUNTER — Other Ambulatory Visit: Payer: Self-pay | Admitting: Gastroenterology

## 2020-02-24 ENCOUNTER — Other Ambulatory Visit (HOSPITAL_COMMUNITY): Payer: Self-pay | Admitting: Gastroenterology

## 2020-02-24 DIAGNOSIS — R110 Nausea: Secondary | ICD-10-CM

## 2020-03-28 ENCOUNTER — Encounter (HOSPITAL_COMMUNITY)
Admission: RE | Admit: 2020-03-28 | Discharge: 2020-03-28 | Disposition: A | Discharge status: Home / Self Care | Payer: Medicare Other | Source: Ambulatory Visit | Attending: Gastroenterology | Admitting: Gastroenterology

## 2020-03-28 ENCOUNTER — Other Ambulatory Visit: Payer: Self-pay

## 2020-03-28 DIAGNOSIS — R110 Nausea: Secondary | ICD-10-CM

## 2020-03-28 MED ORDER — TECHNETIUM TC 99M MEBROFENIN IV KIT
5.4000 | PACK | Freq: Once | INTRAVENOUS | Status: AC | PRN
  Administered 2020-03-28: 5.4 via INTRAVENOUS

## 2020-05-09 ENCOUNTER — Other Ambulatory Visit: Payer: Self-pay | Admitting: Surgery

## 2020-05-22 ENCOUNTER — Ambulatory Visit: Payer: Medicare Other | Admitting: Orthopaedic Surgery

## 2020-05-31 ENCOUNTER — Ambulatory Visit: Payer: Medicare Other | Admitting: Orthopaedic Surgery

## 2020-08-23 DIAGNOSIS — I10 Essential (primary) hypertension: Secondary | ICD-10-CM | POA: Diagnosis not present

## 2020-08-23 DIAGNOSIS — E119 Type 2 diabetes mellitus without complications: Secondary | ICD-10-CM | POA: Diagnosis not present

## 2020-08-23 DIAGNOSIS — R7989 Other specified abnormal findings of blood chemistry: Secondary | ICD-10-CM | POA: Diagnosis not present

## 2020-08-23 DIAGNOSIS — E785 Hyperlipidemia, unspecified: Secondary | ICD-10-CM | POA: Diagnosis not present

## 2020-08-30 DIAGNOSIS — L989 Disorder of the skin and subcutaneous tissue, unspecified: Secondary | ICD-10-CM | POA: Diagnosis not present

## 2020-08-30 DIAGNOSIS — I10 Essential (primary) hypertension: Secondary | ICD-10-CM | POA: Diagnosis not present

## 2020-09-04 DIAGNOSIS — D1801 Hemangioma of skin and subcutaneous tissue: Secondary | ICD-10-CM | POA: Diagnosis not present

## 2020-09-04 DIAGNOSIS — L820 Inflamed seborrheic keratosis: Secondary | ICD-10-CM | POA: Diagnosis not present

## 2020-09-13 DIAGNOSIS — E785 Hyperlipidemia, unspecified: Secondary | ICD-10-CM | POA: Diagnosis not present

## 2020-09-13 DIAGNOSIS — E119 Type 2 diabetes mellitus without complications: Secondary | ICD-10-CM | POA: Diagnosis not present

## 2020-10-05 DIAGNOSIS — I10 Essential (primary) hypertension: Secondary | ICD-10-CM | POA: Diagnosis not present

## 2020-10-05 DIAGNOSIS — E1121 Type 2 diabetes mellitus with diabetic nephropathy: Secondary | ICD-10-CM | POA: Diagnosis not present

## 2020-10-05 DIAGNOSIS — E785 Hyperlipidemia, unspecified: Secondary | ICD-10-CM | POA: Diagnosis not present

## 2020-11-21 DIAGNOSIS — E785 Hyperlipidemia, unspecified: Secondary | ICD-10-CM | POA: Diagnosis not present

## 2020-11-21 DIAGNOSIS — I10 Essential (primary) hypertension: Secondary | ICD-10-CM | POA: Diagnosis not present

## 2020-11-21 DIAGNOSIS — E1121 Type 2 diabetes mellitus with diabetic nephropathy: Secondary | ICD-10-CM | POA: Diagnosis not present

## 2020-11-21 DIAGNOSIS — R55 Syncope and collapse: Secondary | ICD-10-CM | POA: Diagnosis not present

## 2020-12-07 DIAGNOSIS — R5383 Other fatigue: Secondary | ICD-10-CM | POA: Diagnosis not present

## 2020-12-07 DIAGNOSIS — I10 Essential (primary) hypertension: Secondary | ICD-10-CM | POA: Diagnosis not present

## 2020-12-07 DIAGNOSIS — E785 Hyperlipidemia, unspecified: Secondary | ICD-10-CM | POA: Diagnosis not present

## 2020-12-07 DIAGNOSIS — R7989 Other specified abnormal findings of blood chemistry: Secondary | ICD-10-CM | POA: Diagnosis not present

## 2020-12-07 DIAGNOSIS — E1149 Type 2 diabetes mellitus with other diabetic neurological complication: Secondary | ICD-10-CM | POA: Diagnosis not present

## 2020-12-14 DIAGNOSIS — I10 Essential (primary) hypertension: Secondary | ICD-10-CM | POA: Diagnosis not present

## 2020-12-14 DIAGNOSIS — E785 Hyperlipidemia, unspecified: Secondary | ICD-10-CM | POA: Diagnosis not present

## 2020-12-14 DIAGNOSIS — E119 Type 2 diabetes mellitus without complications: Secondary | ICD-10-CM | POA: Diagnosis not present

## 2021-02-21 DIAGNOSIS — I10 Essential (primary) hypertension: Secondary | ICD-10-CM | POA: Diagnosis not present

## 2021-02-21 DIAGNOSIS — E1149 Type 2 diabetes mellitus with other diabetic neurological complication: Secondary | ICD-10-CM | POA: Diagnosis not present

## 2021-02-21 DIAGNOSIS — E785 Hyperlipidemia, unspecified: Secondary | ICD-10-CM | POA: Diagnosis not present

## 2021-06-20 DIAGNOSIS — E119 Type 2 diabetes mellitus without complications: Secondary | ICD-10-CM | POA: Diagnosis not present

## 2021-06-20 DIAGNOSIS — E785 Hyperlipidemia, unspecified: Secondary | ICD-10-CM | POA: Diagnosis not present

## 2021-06-25 DIAGNOSIS — I10 Essential (primary) hypertension: Secondary | ICD-10-CM | POA: Diagnosis not present

## 2021-06-25 DIAGNOSIS — R3914 Feeling of incomplete bladder emptying: Secondary | ICD-10-CM | POA: Diagnosis not present

## 2021-06-25 DIAGNOSIS — E785 Hyperlipidemia, unspecified: Secondary | ICD-10-CM | POA: Diagnosis not present

## 2021-06-25 DIAGNOSIS — E1149 Type 2 diabetes mellitus with other diabetic neurological complication: Secondary | ICD-10-CM | POA: Diagnosis not present

## 2021-12-18 DIAGNOSIS — E785 Hyperlipidemia, unspecified: Secondary | ICD-10-CM | POA: Diagnosis not present

## 2021-12-18 DIAGNOSIS — E1149 Type 2 diabetes mellitus with other diabetic neurological complication: Secondary | ICD-10-CM | POA: Diagnosis not present

## 2021-12-18 IMAGING — NM NM HEPATO W/GB/PHARM/[PERSON_NAME]
3 series · 18 of 18 positions shown · non-contrast
Comparison: CT and ultrasound examinations 12/03/2019

CLINICAL DATA: Nausea and unintentional weight loss.

EXAM:
NUCLEAR MEDICINE HEPATOBILIARY IMAGING
TECHNIQUE: Sequential images of the abdomen were obtained [DATE] minutes
following intravenous administration of radiopharmaceutical.
RADIOPHARMACEUTICALS:  5.4 mCi 7c-WWm  Choletec IV

[Series 1: hida scan · 3.28mm/px · 6 of 30 frames shown (1 of 3)]
[frame 3/30]
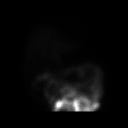
[frame 8/30]
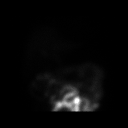
[frame 13/30]
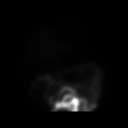
[frame 18/30]
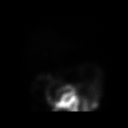
[frame 23/30]
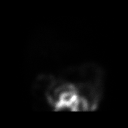
[frame 28/30]
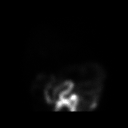

[Series 1: hida scan · 3.28mm/px · 6 of 60 frames shown (2 of 3)]
[frame 6/60]
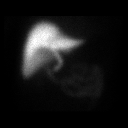
[frame 16/60]
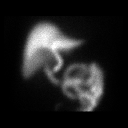
[frame 26/60]
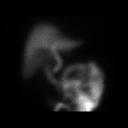
[frame 36/60]
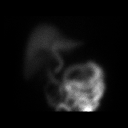
[frame 46/60]
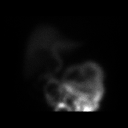
[frame 56/60]
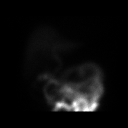

[Series 1: hida scan · 3.28mm/px · 6 of 30 frames shown (3 of 3)]
[frame 3/30]
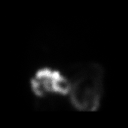
[frame 8/30]
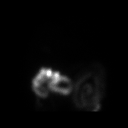
[frame 13/30]
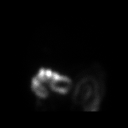
[frame 18/30]
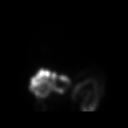
[frame 23/30]
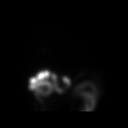
[frame 28/30]
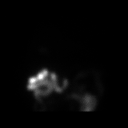

[18 of 18 positions shown; findings below may reference images not displayed]

FINDINGS: Prompt and symmetric uptake in the liver. Prompt excretion into the
biliary tree which is visualized by 5 minutes. Activity is seen in
the small bowel by 10 minutes.

Delayed appearance of the gallbladder after 1 hour.
IMPRESSION: Patent cystic and common bile ducts.

## 2021-12-25 DIAGNOSIS — I10 Essential (primary) hypertension: Secondary | ICD-10-CM | POA: Diagnosis not present

## 2021-12-25 DIAGNOSIS — E785 Hyperlipidemia, unspecified: Secondary | ICD-10-CM | POA: Diagnosis not present

## 2021-12-25 DIAGNOSIS — Z0000 Encounter for general adult medical examination without abnormal findings: Secondary | ICD-10-CM | POA: Diagnosis not present

## 2021-12-25 DIAGNOSIS — E1143 Type 2 diabetes mellitus with diabetic autonomic (poly)neuropathy: Secondary | ICD-10-CM | POA: Diagnosis not present

## 2021-12-25 DIAGNOSIS — F5101 Primary insomnia: Secondary | ICD-10-CM | POA: Diagnosis not present

## 2021-12-25 DIAGNOSIS — E1121 Type 2 diabetes mellitus with diabetic nephropathy: Secondary | ICD-10-CM | POA: Diagnosis not present

## 2022-02-12 DIAGNOSIS — H40003 Preglaucoma, unspecified, bilateral: Secondary | ICD-10-CM | POA: Diagnosis not present

## 2022-02-12 DIAGNOSIS — H25813 Combined forms of age-related cataract, bilateral: Secondary | ICD-10-CM | POA: Diagnosis not present

## 2022-02-12 DIAGNOSIS — H524 Presbyopia: Secondary | ICD-10-CM | POA: Diagnosis not present

## 2022-02-12 DIAGNOSIS — H43813 Vitreous degeneration, bilateral: Secondary | ICD-10-CM | POA: Diagnosis not present

## 2022-02-12 DIAGNOSIS — E119 Type 2 diabetes mellitus without complications: Secondary | ICD-10-CM | POA: Diagnosis not present

## 2022-03-14 DIAGNOSIS — Z87442 Personal history of urinary calculi: Secondary | ICD-10-CM | POA: Diagnosis not present

## 2022-03-14 DIAGNOSIS — E119 Type 2 diabetes mellitus without complications: Secondary | ICD-10-CM | POA: Diagnosis not present

## 2022-03-14 DIAGNOSIS — Z794 Long term (current) use of insulin: Secondary | ICD-10-CM | POA: Diagnosis not present

## 2022-04-04 DIAGNOSIS — I10 Essential (primary) hypertension: Secondary | ICD-10-CM | POA: Diagnosis not present

## 2022-04-04 DIAGNOSIS — E1121 Type 2 diabetes mellitus with diabetic nephropathy: Secondary | ICD-10-CM | POA: Diagnosis not present

## 2022-04-04 DIAGNOSIS — E785 Hyperlipidemia, unspecified: Secondary | ICD-10-CM | POA: Diagnosis not present

## 2022-04-04 DIAGNOSIS — E1143 Type 2 diabetes mellitus with diabetic autonomic (poly)neuropathy: Secondary | ICD-10-CM | POA: Diagnosis not present

## 2022-06-13 DIAGNOSIS — Z794 Long term (current) use of insulin: Secondary | ICD-10-CM | POA: Diagnosis not present

## 2022-06-13 DIAGNOSIS — R350 Frequency of micturition: Secondary | ICD-10-CM | POA: Diagnosis not present

## 2022-06-13 DIAGNOSIS — E119 Type 2 diabetes mellitus without complications: Secondary | ICD-10-CM | POA: Diagnosis not present

## 2022-06-13 DIAGNOSIS — Z960 Presence of urogenital implants: Secondary | ICD-10-CM | POA: Diagnosis not present

## 2022-06-19 DIAGNOSIS — I10 Essential (primary) hypertension: Secondary | ICD-10-CM | POA: Diagnosis not present

## 2022-06-19 DIAGNOSIS — E1121 Type 2 diabetes mellitus with diabetic nephropathy: Secondary | ICD-10-CM | POA: Diagnosis not present

## 2022-06-19 DIAGNOSIS — E785 Hyperlipidemia, unspecified: Secondary | ICD-10-CM | POA: Diagnosis not present

## 2022-06-26 DIAGNOSIS — F5101 Primary insomnia: Secondary | ICD-10-CM | POA: Diagnosis not present

## 2022-06-26 DIAGNOSIS — E1143 Type 2 diabetes mellitus with diabetic autonomic (poly)neuropathy: Secondary | ICD-10-CM | POA: Diagnosis not present

## 2022-06-26 DIAGNOSIS — E785 Hyperlipidemia, unspecified: Secondary | ICD-10-CM | POA: Diagnosis not present

## 2022-06-26 DIAGNOSIS — E1121 Type 2 diabetes mellitus with diabetic nephropathy: Secondary | ICD-10-CM | POA: Diagnosis not present

## 2022-06-26 DIAGNOSIS — I10 Essential (primary) hypertension: Secondary | ICD-10-CM | POA: Diagnosis not present

## 2022-09-18 DIAGNOSIS — E1121 Type 2 diabetes mellitus with diabetic nephropathy: Secondary | ICD-10-CM | POA: Diagnosis not present

## 2022-09-18 DIAGNOSIS — I10 Essential (primary) hypertension: Secondary | ICD-10-CM | POA: Diagnosis not present

## 2022-09-18 DIAGNOSIS — R5383 Other fatigue: Secondary | ICD-10-CM | POA: Diagnosis not present

## 2022-09-24 DIAGNOSIS — I10 Essential (primary) hypertension: Secondary | ICD-10-CM | POA: Diagnosis not present

## 2022-09-24 DIAGNOSIS — E1121 Type 2 diabetes mellitus with diabetic nephropathy: Secondary | ICD-10-CM | POA: Diagnosis not present

## 2022-09-24 DIAGNOSIS — E1143 Type 2 diabetes mellitus with diabetic autonomic (poly)neuropathy: Secondary | ICD-10-CM | POA: Diagnosis not present

## 2022-09-24 DIAGNOSIS — E785 Hyperlipidemia, unspecified: Secondary | ICD-10-CM | POA: Diagnosis not present

## 2022-09-25 DIAGNOSIS — E1143 Type 2 diabetes mellitus with diabetic autonomic (poly)neuropathy: Secondary | ICD-10-CM | POA: Diagnosis not present

## 2022-09-25 DIAGNOSIS — I10 Essential (primary) hypertension: Secondary | ICD-10-CM | POA: Diagnosis not present

## 2022-09-25 DIAGNOSIS — E785 Hyperlipidemia, unspecified: Secondary | ICD-10-CM | POA: Diagnosis not present

## 2022-09-25 DIAGNOSIS — E1121 Type 2 diabetes mellitus with diabetic nephropathy: Secondary | ICD-10-CM | POA: Diagnosis not present

## 2022-09-25 DIAGNOSIS — F5101 Primary insomnia: Secondary | ICD-10-CM | POA: Diagnosis not present

## 2022-12-24 DIAGNOSIS — E785 Hyperlipidemia, unspecified: Secondary | ICD-10-CM | POA: Diagnosis not present

## 2022-12-24 DIAGNOSIS — E1121 Type 2 diabetes mellitus with diabetic nephropathy: Secondary | ICD-10-CM | POA: Diagnosis not present

## 2022-12-24 DIAGNOSIS — I10 Essential (primary) hypertension: Secondary | ICD-10-CM | POA: Diagnosis not present

## 2022-12-26 DIAGNOSIS — E1143 Type 2 diabetes mellitus with diabetic autonomic (poly)neuropathy: Secondary | ICD-10-CM | POA: Diagnosis not present

## 2022-12-26 DIAGNOSIS — R55 Syncope and collapse: Secondary | ICD-10-CM | POA: Diagnosis not present

## 2022-12-26 DIAGNOSIS — E785 Hyperlipidemia, unspecified: Secondary | ICD-10-CM | POA: Diagnosis not present

## 2022-12-26 DIAGNOSIS — I10 Essential (primary) hypertension: Secondary | ICD-10-CM | POA: Diagnosis not present

## 2022-12-26 DIAGNOSIS — E1121 Type 2 diabetes mellitus with diabetic nephropathy: Secondary | ICD-10-CM | POA: Diagnosis not present

## 2022-12-31 DIAGNOSIS — R5383 Other fatigue: Secondary | ICD-10-CM | POA: Diagnosis not present

## 2022-12-31 DIAGNOSIS — E1121 Type 2 diabetes mellitus with diabetic nephropathy: Secondary | ICD-10-CM | POA: Diagnosis not present

## 2022-12-31 DIAGNOSIS — E1143 Type 2 diabetes mellitus with diabetic autonomic (poly)neuropathy: Secondary | ICD-10-CM | POA: Diagnosis not present

## 2022-12-31 DIAGNOSIS — I10 Essential (primary) hypertension: Secondary | ICD-10-CM | POA: Diagnosis not present

## 2022-12-31 DIAGNOSIS — F5101 Primary insomnia: Secondary | ICD-10-CM | POA: Diagnosis not present

## 2022-12-31 DIAGNOSIS — E785 Hyperlipidemia, unspecified: Secondary | ICD-10-CM | POA: Diagnosis not present

## 2022-12-31 DIAGNOSIS — D7589 Other specified diseases of blood and blood-forming organs: Secondary | ICD-10-CM | POA: Diagnosis not present

## 2023-01-02 DIAGNOSIS — Z23 Encounter for immunization: Secondary | ICD-10-CM | POA: Diagnosis not present

## 2023-01-13 DIAGNOSIS — D7589 Other specified diseases of blood and blood-forming organs: Secondary | ICD-10-CM | POA: Diagnosis not present

## 2023-01-13 DIAGNOSIS — R5383 Other fatigue: Secondary | ICD-10-CM | POA: Diagnosis not present

## 2023-01-14 DIAGNOSIS — I10 Essential (primary) hypertension: Secondary | ICD-10-CM | POA: Diagnosis not present

## 2023-01-14 DIAGNOSIS — E1149 Type 2 diabetes mellitus with other diabetic neurological complication: Secondary | ICD-10-CM | POA: Diagnosis not present

## 2023-01-14 DIAGNOSIS — R053 Chronic cough: Secondary | ICD-10-CM | POA: Diagnosis not present

## 2023-01-14 DIAGNOSIS — Z87891 Personal history of nicotine dependence: Secondary | ICD-10-CM | POA: Diagnosis not present

## 2023-01-14 DIAGNOSIS — E785 Hyperlipidemia, unspecified: Secondary | ICD-10-CM | POA: Diagnosis not present

## 2023-01-15 ENCOUNTER — Other Ambulatory Visit: Payer: Self-pay

## 2023-01-15 DIAGNOSIS — E1121 Type 2 diabetes mellitus with diabetic nephropathy: Secondary | ICD-10-CM | POA: Diagnosis not present

## 2023-01-15 DIAGNOSIS — R053 Chronic cough: Secondary | ICD-10-CM

## 2023-01-15 DIAGNOSIS — Z87891 Personal history of nicotine dependence: Secondary | ICD-10-CM

## 2023-02-06 ENCOUNTER — Ambulatory Visit
Admission: RE | Admit: 2023-02-06 | Discharge: 2023-02-06 | Disposition: A | Discharge status: Home / Self Care | Payer: Medicare Other | Source: Ambulatory Visit

## 2023-02-06 DIAGNOSIS — Z87891 Personal history of nicotine dependence: Secondary | ICD-10-CM | POA: Diagnosis not present

## 2023-02-06 DIAGNOSIS — R053 Chronic cough: Secondary | ICD-10-CM

## 2023-02-27 DIAGNOSIS — E1121 Type 2 diabetes mellitus with diabetic nephropathy: Secondary | ICD-10-CM | POA: Diagnosis not present

## 2023-02-27 DIAGNOSIS — E785 Hyperlipidemia, unspecified: Secondary | ICD-10-CM | POA: Diagnosis not present

## 2023-02-27 DIAGNOSIS — I10 Essential (primary) hypertension: Secondary | ICD-10-CM | POA: Diagnosis not present

## 2023-02-28 DIAGNOSIS — H43813 Vitreous degeneration, bilateral: Secondary | ICD-10-CM | POA: Diagnosis not present

## 2023-02-28 DIAGNOSIS — H02831 Dermatochalasis of right upper eyelid: Secondary | ICD-10-CM | POA: Diagnosis not present

## 2023-02-28 DIAGNOSIS — E119 Type 2 diabetes mellitus without complications: Secondary | ICD-10-CM | POA: Diagnosis not present

## 2023-02-28 DIAGNOSIS — H40013 Open angle with borderline findings, low risk, bilateral: Secondary | ICD-10-CM | POA: Diagnosis not present

## 2023-02-28 DIAGNOSIS — H25813 Combined forms of age-related cataract, bilateral: Secondary | ICD-10-CM | POA: Diagnosis not present

## 2023-02-28 DIAGNOSIS — H02834 Dermatochalasis of left upper eyelid: Secondary | ICD-10-CM | POA: Diagnosis not present

## 2023-02-28 DIAGNOSIS — H526 Other disorders of refraction: Secondary | ICD-10-CM | POA: Diagnosis not present

## 2023-05-07 DIAGNOSIS — E1121 Type 2 diabetes mellitus with diabetic nephropathy: Secondary | ICD-10-CM | POA: Diagnosis not present

## 2023-05-07 DIAGNOSIS — E785 Hyperlipidemia, unspecified: Secondary | ICD-10-CM | POA: Diagnosis not present

## 2023-05-07 DIAGNOSIS — I10 Essential (primary) hypertension: Secondary | ICD-10-CM | POA: Diagnosis not present

## 2023-05-17 DIAGNOSIS — H7291 Unspecified perforation of tympanic membrane, right ear: Secondary | ICD-10-CM | POA: Diagnosis not present

## 2023-05-17 DIAGNOSIS — H60501 Unspecified acute noninfective otitis externa, right ear: Secondary | ICD-10-CM | POA: Diagnosis not present

## 2023-07-10 DIAGNOSIS — E785 Hyperlipidemia, unspecified: Secondary | ICD-10-CM | POA: Diagnosis not present

## 2023-07-10 DIAGNOSIS — I10 Essential (primary) hypertension: Secondary | ICD-10-CM | POA: Diagnosis not present

## 2023-07-10 DIAGNOSIS — E1149 Type 2 diabetes mellitus with other diabetic neurological complication: Secondary | ICD-10-CM | POA: Diagnosis not present

## 2023-07-17 DIAGNOSIS — D7589 Other specified diseases of blood and blood-forming organs: Secondary | ICD-10-CM | POA: Diagnosis not present

## 2023-07-17 DIAGNOSIS — E785 Hyperlipidemia, unspecified: Secondary | ICD-10-CM | POA: Diagnosis not present

## 2023-07-17 DIAGNOSIS — F5101 Primary insomnia: Secondary | ICD-10-CM | POA: Diagnosis not present

## 2023-07-17 DIAGNOSIS — I10 Essential (primary) hypertension: Secondary | ICD-10-CM | POA: Diagnosis not present

## 2023-07-17 DIAGNOSIS — E1121 Type 2 diabetes mellitus with diabetic nephropathy: Secondary | ICD-10-CM | POA: Diagnosis not present

## 2023-07-17 DIAGNOSIS — R5383 Other fatigue: Secondary | ICD-10-CM | POA: Diagnosis not present

## 2023-07-17 DIAGNOSIS — E1143 Type 2 diabetes mellitus with diabetic autonomic (poly)neuropathy: Secondary | ICD-10-CM | POA: Diagnosis not present

## 2023-10-16 DIAGNOSIS — E785 Hyperlipidemia, unspecified: Secondary | ICD-10-CM | POA: Diagnosis not present

## 2023-10-16 DIAGNOSIS — R350 Frequency of micturition: Secondary | ICD-10-CM | POA: Diagnosis not present

## 2023-10-16 DIAGNOSIS — I10 Essential (primary) hypertension: Secondary | ICD-10-CM | POA: Diagnosis not present

## 2023-10-16 DIAGNOSIS — E119 Type 2 diabetes mellitus without complications: Secondary | ICD-10-CM | POA: Diagnosis not present

## 2023-10-16 DIAGNOSIS — E1121 Type 2 diabetes mellitus with diabetic nephropathy: Secondary | ICD-10-CM | POA: Diagnosis not present

## 2023-11-27 DIAGNOSIS — I10 Essential (primary) hypertension: Secondary | ICD-10-CM | POA: Diagnosis not present

## 2023-11-27 DIAGNOSIS — E785 Hyperlipidemia, unspecified: Secondary | ICD-10-CM | POA: Diagnosis not present

## 2023-11-27 DIAGNOSIS — E1121 Type 2 diabetes mellitus with diabetic nephropathy: Secondary | ICD-10-CM | POA: Diagnosis not present

## 2024-01-08 DIAGNOSIS — I10 Essential (primary) hypertension: Secondary | ICD-10-CM | POA: Diagnosis not present

## 2024-01-08 DIAGNOSIS — E785 Hyperlipidemia, unspecified: Secondary | ICD-10-CM | POA: Diagnosis not present

## 2024-01-08 DIAGNOSIS — E1121 Type 2 diabetes mellitus with diabetic nephropathy: Secondary | ICD-10-CM | POA: Diagnosis not present

## 2024-01-14 DIAGNOSIS — E1121 Type 2 diabetes mellitus with diabetic nephropathy: Secondary | ICD-10-CM | POA: Diagnosis not present

## 2024-01-14 DIAGNOSIS — E785 Hyperlipidemia, unspecified: Secondary | ICD-10-CM | POA: Diagnosis not present

## 2024-01-21 DIAGNOSIS — E785 Hyperlipidemia, unspecified: Secondary | ICD-10-CM | POA: Diagnosis not present

## 2024-01-21 DIAGNOSIS — E1143 Type 2 diabetes mellitus with diabetic autonomic (poly)neuropathy: Secondary | ICD-10-CM | POA: Diagnosis not present

## 2024-01-21 DIAGNOSIS — R5383 Other fatigue: Secondary | ICD-10-CM | POA: Diagnosis not present

## 2024-01-21 DIAGNOSIS — I10 Essential (primary) hypertension: Secondary | ICD-10-CM | POA: Diagnosis not present

## 2024-01-21 DIAGNOSIS — E1121 Type 2 diabetes mellitus with diabetic nephropathy: Secondary | ICD-10-CM | POA: Diagnosis not present

## 2024-01-21 DIAGNOSIS — F5101 Primary insomnia: Secondary | ICD-10-CM | POA: Diagnosis not present

## 2024-01-21 DIAGNOSIS — Z0000 Encounter for general adult medical examination without abnormal findings: Secondary | ICD-10-CM | POA: Diagnosis not present

## 2024-01-21 DIAGNOSIS — D7589 Other specified diseases of blood and blood-forming organs: Secondary | ICD-10-CM | POA: Diagnosis not present

## 2024-01-21 DIAGNOSIS — R3915 Urgency of urination: Secondary | ICD-10-CM | POA: Diagnosis not present
